# Patient Record
Sex: Male | Born: 1965 | Race: White | Hispanic: No | Marital: Single | Smoking: Current every day smoker
Health system: Southern US, Community
[De-identification: ages and names within clinical notes are randomized; demographics above are authoritative.]

## PROBLEM LIST (undated history)

## (undated) HISTORY — PX: ELBOW SURGERY: SHX618

## (undated) HISTORY — PX: SHOULDER SURGERY: SHX246

## (undated) HISTORY — PX: HAND SURGERY: SHX662

## (undated) HISTORY — PX: LEG SURGERY: SHX1003

## (undated) HISTORY — PX: NOSE SURGERY: SHX723

---

## 2006-02-06 ENCOUNTER — Emergency Department (HOSPITAL_COMMUNITY): Admission: EM | Admit: 2006-02-06 | Discharge: 2006-02-06 | Payer: Self-pay | Admitting: Emergency Medicine

## 2006-02-09 ENCOUNTER — Emergency Department (HOSPITAL_COMMUNITY): Admission: EM | Admit: 2006-02-09 | Discharge: 2006-02-10 | Payer: Self-pay | Admitting: Emergency Medicine

## 2010-05-20 ENCOUNTER — Ambulatory Visit: Payer: Self-pay | Admitting: Otolaryngology

## 2011-07-14 ENCOUNTER — Telehealth: Payer: Self-pay

## 2011-07-14 ENCOUNTER — Ambulatory Visit: Payer: BC Managed Care – PPO

## 2011-07-14 ENCOUNTER — Ambulatory Visit (INDEPENDENT_AMBULATORY_CARE_PROVIDER_SITE_OTHER): Payer: BC Managed Care – PPO | Admitting: Emergency Medicine

## 2011-07-14 VITALS — BP 112/74 | HR 73 | Temp 98.5°F | Resp 16 | Ht 70.5 in | Wt 231.6 lb

## 2011-07-14 DIAGNOSIS — M25569 Pain in unspecified knee: Secondary | ICD-10-CM

## 2011-07-14 DIAGNOSIS — W19XXXA Unspecified fall, initial encounter: Secondary | ICD-10-CM

## 2011-07-14 DIAGNOSIS — M545 Low back pain: Secondary | ICD-10-CM

## 2011-07-14 DIAGNOSIS — R109 Unspecified abdominal pain: Secondary | ICD-10-CM

## 2011-07-14 LAB — POCT URINALYSIS DIPSTICK
Bilirubin, UA: NEGATIVE
Blood, UA: NEGATIVE
Glucose, UA: NEGATIVE
Ketones, UA: NEGATIVE
Spec Grav, UA: 1.02
Urobilinogen, UA: 0.2

## 2011-07-14 MED ORDER — HYDROCODONE-ACETAMINOPHEN 5-325 MG PO TABS: 1.0000 | ORAL_TABLET | Freq: Four times a day (QID) | ORAL | Status: AC | PRN

## 2011-07-14 NOTE — Telephone Encounter (Signed)
Xray up front for pt to p/u. Pt notified

## 2011-07-14 NOTE — Progress Notes (Signed)
  Subjective:    Patient ID: Eric Randall, male    DOB: Apr 25, 1966, 46 y.o.   MRN: [id number]  HPI patient states he was doing well until last week while he was up on a step ladder. He was working on [employer] on his home. He had a pair by prescription on the screw and was pulling backwards when he fell backwards onto a retaining wall struck his lower back and then fell forward injuring his right knee. History is pertinent in that in 1993 he had an accident where he broke his femur and has a rod in 1994 he had another accident in course to have surgery on his right knee. He has done well from this until this most recent injury he is complaining of pain in the proximal fibula.    Review of Systems review of systems is positive Eric Randall relates to this acute injury.     Objective:   Physical Exam physical exam shows abrasions present over the upper lumbar area and lower lumbar area. There is tenderness to palpation in both of these areas. Examination of the right knee reveals tenderness which is primarily over the head of the fibula. I have a joint effusion elicited over the knee itself. The area of maximal tenderness is posterior fibular head.  [facility] reading (PRIMARY) by  Dr.Desarai Barrack x-ray shows a crack at the screw that enters through the fibula but the bone is healed around this area. I do not see any fracture through the head of the fibula. Via the back films revealed no fracture per       Assessment & Plan:  Assessment is recent fall with injury to the upper lumbar lower lumbar and right knee area within check her urine to be sure he did not have any renal contusion and get films of his back and right knee.

## 2011-07-14 NOTE — Telephone Encounter (Signed)
Patient would like to pick up a copy of x-ray disc from xrays today.

## 2012-11-22 IMAGING — CR DG KNEE COMPLETE 4+V*R*
4 series · 4 of 4 positions shown · non-contrast
Comparison: None.

CLINICAL DATA: History of painful right knee.  History of injury
from fall.

RIGHT KNEE - COMPLETE 4+ VIEW

[AP]
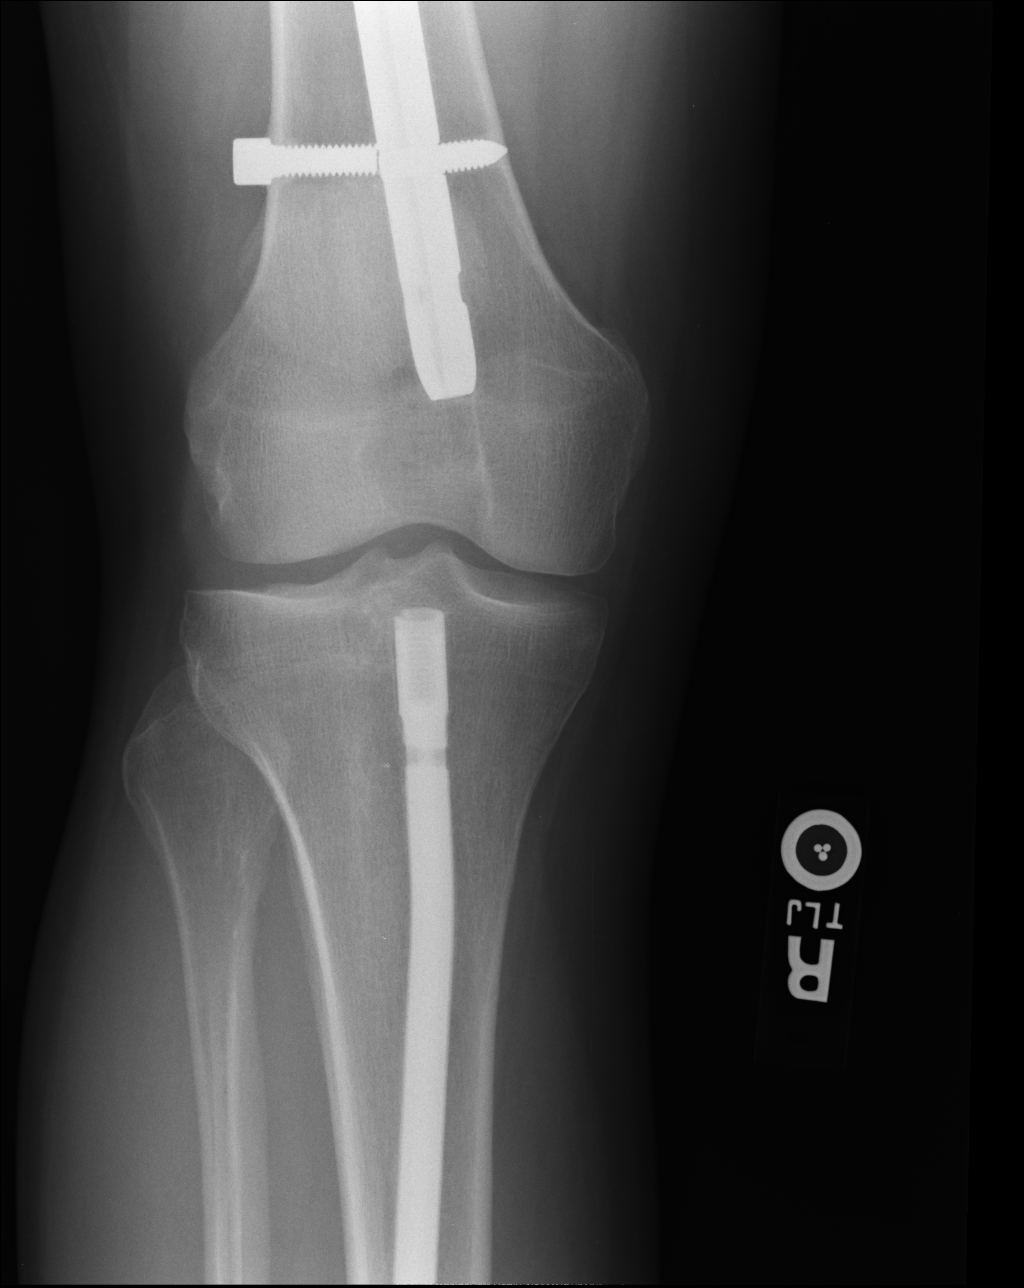

[lateral]
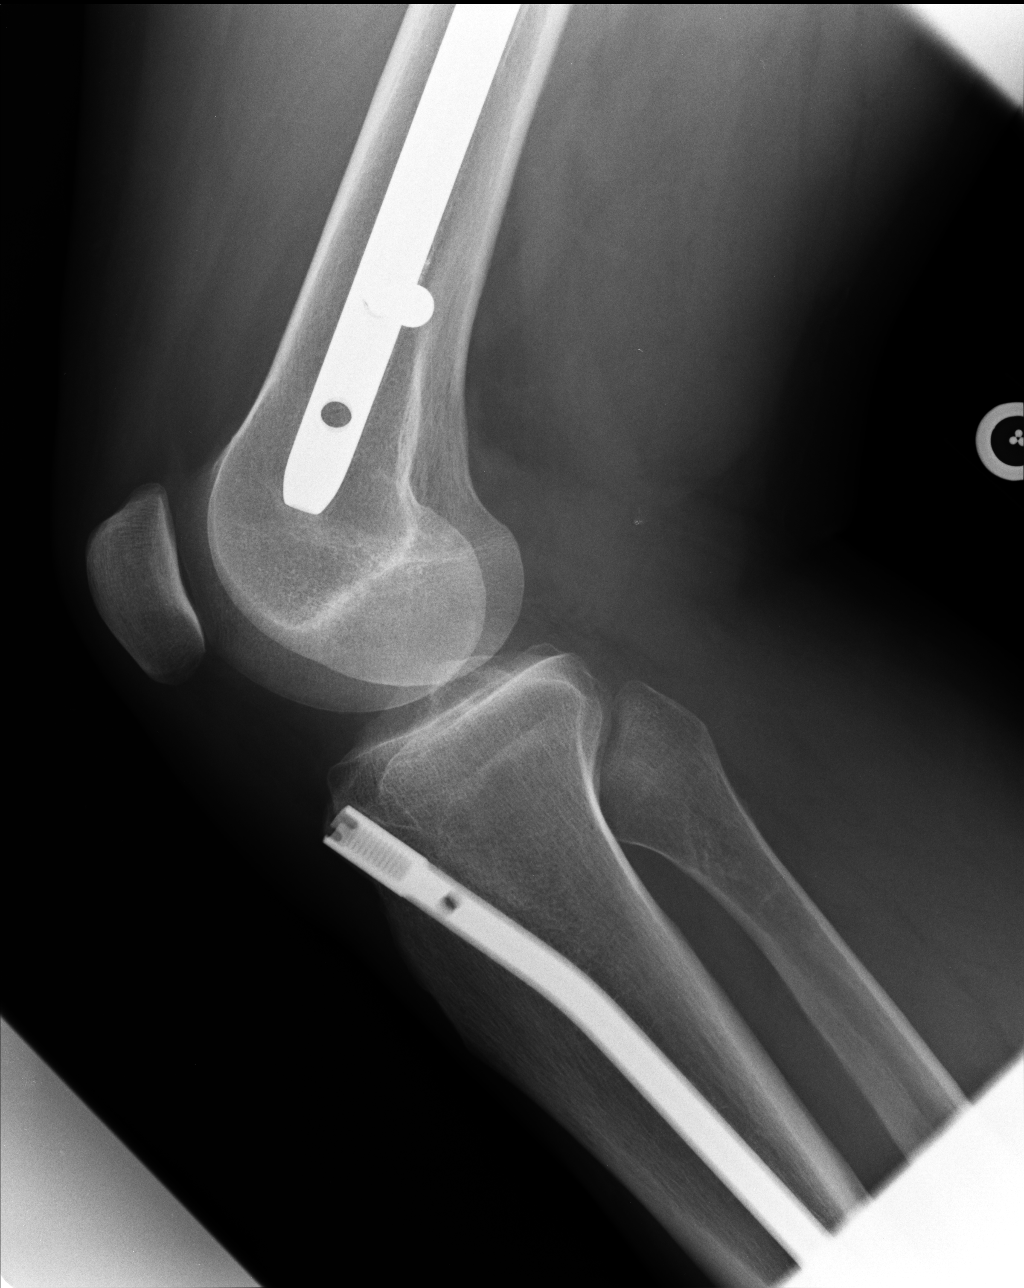

[ap ext rot]
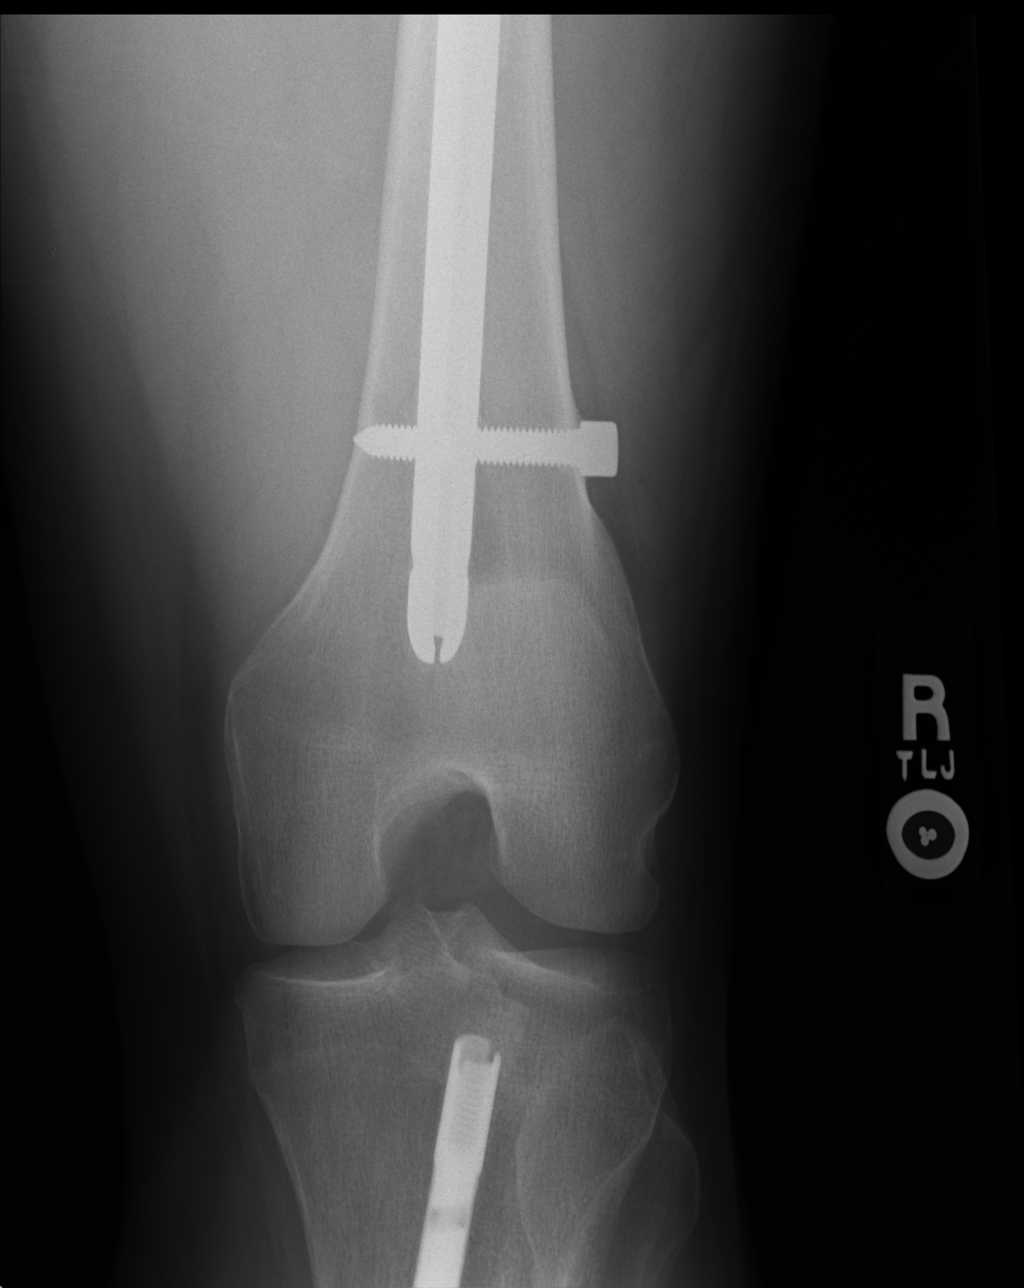

[ap int rot]
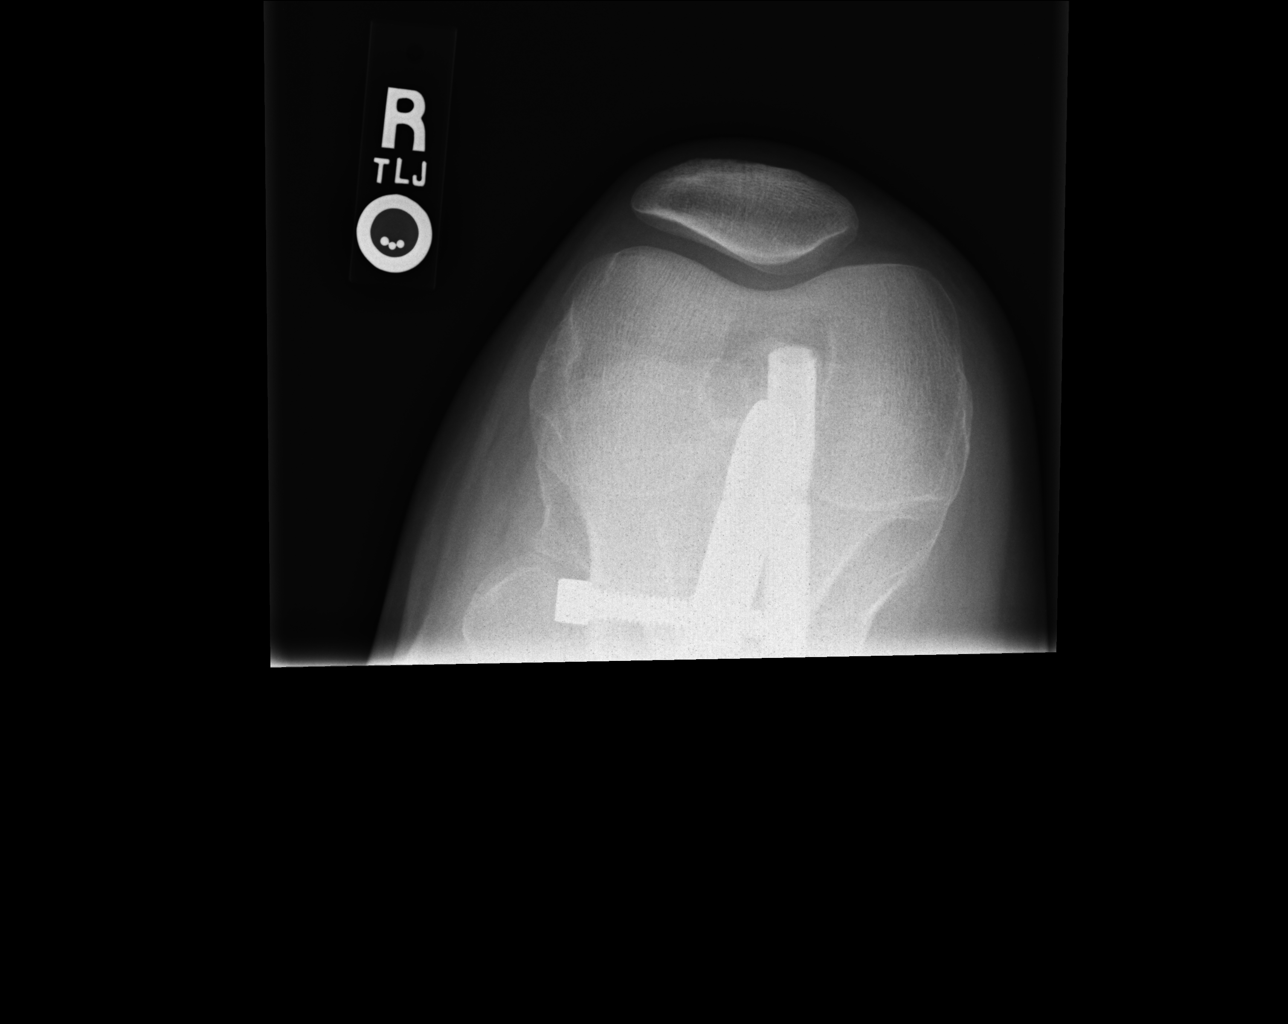

[4 of 4 positions shown; findings below may reference images not displayed]

FINDINGS: Previous ORIF has been performed involving the femur and
tibia.  In the femur there is an intramedullary nail with distal
transverse screw.  On one image there appears to be a crack  in the
screw lateral to the intramedullary nail. There is an
intramedullary nail within the tibia. No abnormal lucency is seen
around hardware.  No bony fracture is evident.  No dislocation is
seen.  Joint spaces are preserved.  No definite joint effusion is
seen.
IMPRESSION: No bony fracture.  No dislocation.  Post ORIF procedures.
 On one image there appears to be a metallic fracture in the screw
lateral to the intramedullary nail, which could be acute or
chronic, but no bony disruption or bony fracture is evident.

## 2013-01-31 ENCOUNTER — Encounter (HOSPITAL_COMMUNITY): Payer: Self-pay | Admitting: Emergency Medicine

## 2013-01-31 ENCOUNTER — Emergency Department (HOSPITAL_COMMUNITY)
Admission: EM | Admit: 2013-01-31 | Discharge: 2013-01-31 | Disposition: A | Discharge status: Home / Self Care | Payer: BC Managed Care – PPO | Attending: Emergency Medicine | Admitting: Emergency Medicine

## 2013-01-31 DIAGNOSIS — H109 Unspecified conjunctivitis: Secondary | ICD-10-CM | POA: Insufficient documentation

## 2013-01-31 DIAGNOSIS — F17200 Nicotine dependence, unspecified, uncomplicated: Secondary | ICD-10-CM | POA: Insufficient documentation

## 2013-01-31 DIAGNOSIS — J069 Acute upper respiratory infection, unspecified: Secondary | ICD-10-CM

## 2013-01-31 DIAGNOSIS — Z79899 Other long term (current) drug therapy: Secondary | ICD-10-CM | POA: Insufficient documentation

## 2013-01-31 NOTE — ED Notes (Signed)
Pt seen at a urgent care this week and has been using drops w/ no improvement, pt states also has a sore throat now.

## 2013-01-31 NOTE — ED Provider Notes (Signed)
CSN: [id number]     Arrival date & time 01/31/13  0340 History   First MD Initiated Contact with Patient 01/31/13 [phone]     Chief Complaint  Patient presents with  . Conjunctivitis  . Sore Throat    HPI Patient reports 3 days of conjunctival injection and eyeI irritation.  He reports discharge and crusting of his eyes.  The patient has been on antibiotic drops for his eyes without improvement in symptoms and now has developed new worsening sore throat over the past 24 hours.  He denies fevers and chills.  He reports upper respiratory symptoms.  No significant change in his vision.  No other complaints.  Symptoms are mild to moderate in severity   History reviewed. No pertinent past medical history. Past Surgical History  Procedure Laterality Date  . Nose surgery     No family history on file. History  Substance Use Topics  . Smoking status: Current Every Day Smoker -- 1.00 packs/day    Types: Cigarettes  . Smokeless tobacco: Not on file  . Alcohol Use: No    Review of Systems  All other systems reviewed and are negative.    Allergies  Review of patient's allergies indicates no known allergies.  Home Medications   Current Outpatient Rx  Name  Route  Sig  Dispense  Refill  . ibuprofen (ADVIL,MOTRIN) 800 MG tablet   Oral   Take 800 mg by mouth every 8 (eight) hours as needed.         . Multiple Vitamin (MULTIVITAMIN) capsule   Oral   Take 1 capsule by mouth daily.         Marland Kitchen trimethoprim-polymyxin b (POLYTRIM) ophthalmic solution   Both Eyes   Place 2 drops into both eyes every 4 (four) hours.         Marland Kitchen UNABLE TO FIND      hydroxcut ( weight loss)          BP 135/93  Pulse 73  Temp(Src) 98.1 F (36.7 C) (Oral)  SpO2 99% Physical Exam  Nursing note and vitals reviewed. Constitutional: He is oriented to person, place, and time. He appears well-developed and well-nourished.  HENT:  Head: Normocephalic and atraumatic.  Uvula midline. No tonsillar  swelling. Mild posterior pharyngeal erythema. No exudates  Eyes: EOM are normal.  Bilateral conjunctival injection with discharge  Neck: Normal range of motion.  Cardiovascular: Normal rate, regular rhythm, normal heart sounds and intact distal pulses.   Pulmonary/Chest: Effort normal and breath sounds normal. No respiratory distress.  Abdominal: Soft. He exhibits no distension. There is no tenderness.  Musculoskeletal: Normal range of motion.  Neurological: He is alert and oriented to person, place, and time.  Skin: Skin is warm and dry.  Psychiatric: He has a normal mood and affect. Judgment normal.    ED Course  Procedures (including critical care time) Labs Review Labs Reviewed - No data to display Imaging Review No results found.  MDM   1. Viral URI   2. Conjunctivitis    Viral URI with conjunctivitis.     Lyanne Co, MD 01/31/13 [phone]

## 2020-06-27 ENCOUNTER — Other Ambulatory Visit: Payer: Self-pay

## 2020-06-27 ENCOUNTER — Ambulatory Visit
Admission: EM | Admit: 2020-06-27 | Discharge: 2020-06-27 | Disposition: A | Discharge status: Home / Self Care | Payer: Self-pay | Attending: Emergency Medicine | Admitting: Emergency Medicine

## 2020-06-27 DIAGNOSIS — M545 Low back pain, unspecified: Secondary | ICD-10-CM

## 2020-06-27 DIAGNOSIS — M62830 Muscle spasm of back: Secondary | ICD-10-CM

## 2020-06-27 MED ORDER — KETOROLAC TROMETHAMINE 60 MG/2ML IM SOLN
60.0000 mg | Freq: Once | INTRAMUSCULAR | Status: AC
  Administered 2020-06-27: 60 mg via INTRAMUSCULAR

## 2020-06-27 MED ORDER — PREDNISONE 10 MG (21) PO TBPK: ORAL_TABLET | Freq: Every day | ORAL | 0 refills | Status: DC

## 2020-06-27 MED ORDER — TIZANIDINE HCL 4 MG PO TABS: 4.0000 mg | ORAL_TABLET | Freq: Four times a day (QID) | ORAL | 0 refills | Status: DC | PRN

## 2020-06-27 MED ORDER — DEXAMETHASONE SODIUM PHOSPHATE 10 MG/ML IJ SOLN
10.0000 mg | Freq: Once | INTRAMUSCULAR | Status: AC
  Administered 2020-06-27: 10 mg via INTRAMUSCULAR

## 2020-06-27 NOTE — ED Provider Notes (Signed)
[facility]   [id number] 06/27/20 Arrival Time: 3893  CC: Back PAIN  SUBJECTIVE: History from: patient. Eric Randall is a 55 y.o. male complains of RT low back pain and tightness x few days.  Denies a precipitating event or specific injury.  Localizes the pain to the RT low back.  Describes the pain as constant and stabbing in character.  Has tried OTC medications without relief.  Symptoms are made worse with movement.  Denies similar symptoms in the past.  Denies fever, chills, erythema, ecchymosis, effusion, weakness, numbness and tingling, saddle paresthesias, loss of bowel or bladder function.      ROS: As per HPI.  All other pertinent ROS negative.     No past medical history on file. Past Surgical History:  Procedure Laterality Date  . NOSE SURGERY     No Known Allergies No current facility-administered medications on file prior to encounter.   Current Outpatient Medications on File Prior to Encounter  Medication Sig Dispense Refill  . ibuprofen (ADVIL,MOTRIN) 800 MG tablet Take 800 mg by mouth every 8 (eight) hours as needed.    . Multiple Vitamin (MULTIVITAMIN) capsule Take 1 capsule by mouth daily.     Social History   Socioeconomic History  . Marital status: Single    Spouse name: Not on file  . Number of children: Not on file  . Years of education: Not on file  . Highest education level: Not on file  Occupational History  . Not on file  Tobacco Use  . Smoking status: Current Every Day Smoker    Packs/day: 1.00    Types: Cigarettes  . Smokeless tobacco: Not on file  Substance and Sexual Activity  . Alcohol use: No  . Drug use: Not on file  . Sexual activity: Not on file  Other Topics Concern  . Not on file  Social History Narrative  . Not on file   Social Determinants of Health   Financial Resource Strain: Not on file  Food Insecurity: Not on file  Transportation Needs: Not on file  Physical Activity: Not on file  Stress: Not on file   Social Connections: Not on file  Intimate Partner Violence: Not on file   No family history on file.  OBJECTIVE:  Vitals:   06/27/20 0935  BP: (!) 149/79  Pulse: 96  Resp: 20  Temp: 97.6 F (36.4 C)  TempSrc: Oral  SpO2: 97%    General appearance: ALERT; appears uncomfortable Head: NCAT Lungs: Normal respiratory effort Musculoskeletal: Back Inspection: Skin warm, dry, clear and intact without obvious erythema, effusion, or ecchymosis.  Palpation: Diffusely TTP over RT low back with associated spasm ROM: LROM about the back Strength: deferred Skin: warm and dry Neurologic: Ambulates with difficulty; Sensation intact about the upper/ lower extremities Psychological: alert and cooperative; normal mood and affect  ASSESSMENT & PLAN:  1. Acute right-sided low back pain without sciatica   2. Back spasm     Meds ordered this encounter  Medications  . predniSONE (STERAPRED UNI-PAK 21 TAB) 10 MG (21) TBPK tablet    Sig: Take by mouth daily. Take 6 tabs by mouth daily  for 2 days, then 5 tabs for 2 days, then 4 tabs for 2 days, then 3 tabs for 2 days, 2 tabs for 2 days, then 1 tab by mouth daily for 2 days    Dispense:  42 tablet    Refill:  0    Order Specific Question:   Supervising Provider  AnswerEustace Moore [[id number]]  . tiZANidine (ZANAFLEX) 4 MG tablet    Sig: Take 1 tablet (4 mg total) by mouth every 6 (six) hours as needed for muscle spasms.    Dispense:  30 tablet    Refill:  0    Order Specific Question:   Supervising Provider    Answer:   Eustace Moore [[id number]]  . dexamethasone (DECADRON) injection 10 mg  . ketorolac (TORADOL) injection 60 mg   Discussed possibility of herniated disc due to pain limited mobility.  Herniated disc evaluation would be best done in the ED.  Patient declines at this time and would like to try outpatient therapy first Decadron and toradol shots given in office Continue conservative management of rest, ice, and  gentle stretches Prednisone taper prescribed.  Use as directed and to completion Take zanaflex at nighttime for symptomatic relief. Avoid driving or operating heavy machinery while using medication. Follow up with PCP if symptoms persist Return or go to the ER if you have any new or worsening symptoms (fever, chills, chest pain, abdominal pain, changes in bowel or bladder habits, pain radiating into lower legs, etc...)    Reviewed expectations re: course of current medical issues. Questions answered. Outlined signs and symptoms indicating need for more acute intervention. Patient verbalized understanding. After Visit Summary given.    Rennis Harding, PA-C 06/27/20 1023

## 2020-06-27 NOTE — Discharge Instructions (Signed)
Discussed possibility of herniated disc due to pain limited mobility.  Herniated disc evaluation would be best done in the ED.  Patient declines at this time and would like to try outpatient therapy first Decadron and toradol shots given in office Continue conservative management of rest, ice, and gentle stretches Prednisone taper prescribed.  Use as directed and to completion Take zanaflex at nighttime for symptomatic relief. Avoid driving or operating heavy machinery while using medication. Follow up with PCP if symptoms persist Return or go to the ER if you have any new or worsening symptoms (fever, chills, chest pain, abdominal pain, changes in bowel or bladder habits, pain radiating into lower legs, etc...)

## 2020-06-27 NOTE — ED Triage Notes (Signed)
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  The center and right lower side of his back is hurting.

## 2020-06-29 ENCOUNTER — Emergency Department (HOSPITAL_COMMUNITY)
Admission: EM | Admit: 2020-06-29 | Discharge: 2020-06-29 | Disposition: A | Discharge status: Home / Self Care | Payer: Self-pay | Attending: Emergency Medicine | Admitting: Emergency Medicine

## 2020-06-29 ENCOUNTER — Encounter (HOSPITAL_COMMUNITY): Payer: Self-pay | Admitting: *Deleted

## 2020-06-29 ENCOUNTER — Other Ambulatory Visit: Payer: Self-pay

## 2020-06-29 DIAGNOSIS — M545 Low back pain, unspecified: Secondary | ICD-10-CM | POA: Insufficient documentation

## 2020-06-29 DIAGNOSIS — F17210 Nicotine dependence, cigarettes, uncomplicated: Secondary | ICD-10-CM | POA: Insufficient documentation

## 2020-06-29 DIAGNOSIS — E669 Obesity, unspecified: Secondary | ICD-10-CM | POA: Insufficient documentation

## 2020-06-29 DIAGNOSIS — Z6837 Body mass index (BMI) 37.0-37.9, adult: Secondary | ICD-10-CM | POA: Insufficient documentation

## 2020-06-29 MED ORDER — KETOROLAC TROMETHAMINE 30 MG/ML IJ SOLN
30.0000 mg | Freq: Once | INTRAMUSCULAR | Status: AC
  Administered 2020-06-29: 30 mg via INTRAMUSCULAR
  Filled 2020-06-29: qty 1

## 2020-06-29 MED ORDER — LIDOCAINE-EPINEPHRINE 2 %-1:100000 IJ SOLN
20.0000 mL | Freq: Once | INTRAMUSCULAR | Status: AC
  Administered 2020-06-29: 20 mL
  Filled 2020-06-29: qty 20

## 2020-06-29 MED ORDER — METHOCARBAMOL 500 MG PO TABS
500.0000 mg | ORAL_TABLET | Freq: Once | ORAL | Status: AC
  Administered 2020-06-29: 500 mg via ORAL
  Filled 2020-06-29: qty 1

## 2020-06-29 MED ORDER — BUPIVACAINE HCL (PF) 0.25 % IJ SOLN
10.0000 mL | Freq: Once | INTRAMUSCULAR | Status: AC
  Administered 2020-06-29: 10 mL
  Filled 2020-06-29: qty 30

## 2020-06-29 MED ORDER — LIDOCAINE-EPINEPHRINE (PF) 2 %-1:200000 IJ SOLN
INTRAMUSCULAR | Status: AC
  Administered 2020-06-29: 20 mL
  Filled 2020-06-29: qty 20

## 2020-06-29 MED ORDER — HYDROCODONE-ACETAMINOPHEN 5-325 MG PO TABS: 1.0000 | ORAL_TABLET | Freq: Four times a day (QID) | ORAL | 0 refills | Status: AC | PRN

## 2020-06-29 MED ORDER — METHOCARBAMOL 500 MG PO TABS: 500.0000 mg | ORAL_TABLET | Freq: Two times a day (BID) | ORAL | 0 refills | Status: AC

## 2020-06-29 NOTE — Discharge Instructions (Addendum)
You were seen here today for Back Pain: Low back pain is discomfort in the lower back that may be due to injuries to muscles and ligaments around the spine. Occasionally, it may be caused by a problem to a part of the spine called a disc. Your back pain should be treated with medicines listed below as well as back exercises and this back pain should get better over the next 2 weeks. Most patients get completely well in 4 weeks. It is important to know however, if you develop severe or worsening pain, low back pain with fever, numbness, weakness or inability to walk or urinate, you should return to the ER immediately.  Please follow up with your doctor this week for a recheck if still having symptoms.  HOME INSTRUCTIONS Self - care:  The application of heat can help soothe the pain.  Maintaining your daily activities, including walking (this is encouraged), as it will help you get better faster than just staying in bed. Do not life, push, pull anything more than 10 pounds for the next week. I am attaching back exercises that you can do at home to help facilitate your recovery.   Back Exercises - I have attached a handout on back exercises that can be done at home to help facilitate your recovery.   Medications are also useful to help with pain control.   Acetaminophen.  This medication is generally safe, and found over the counter. Take as directed for your age. You should not take more than 8 of the extra strength (500mg ) pills a day (max dose is 4000mg  total OVER one day)  Non steroidal anti inflammatory: This includes medications including Ibuprofen, naproxen and Mobic; These medications help both pain and swelling and are very useful in treating back pain.  They should be taken with food, as they can cause stomach upset, and more seriously, stomach bleeding. Do not combine the medications.   Lidocaine Patch: Salon Pas lidocaine patches (blue and silver box) can be purchased over the counter and worn  for 12 hours for local pain relief   Norco: Can be used every 6-8 hours as needed for severe breakthrough pain, this is a narcotic pain medication, can cause drowsiness, please use with caution.  Muscle relaxants:  These medications can help with muscle tightness that is a cause of lower back pain.  Most of these medications can cause drowsiness, and it is not safe to drive or use dangerous machinery while taking them. They are primarily helpful when taken at night before sleep.  Prednisone -This is an oral steroid.  This medication is best taken with food in the morning.  Please note that this medication can cause anxiety, mood swings, muscle fatigue, increased hunger, weight gain (sodium/fluid retention), poor sleep as well as other symptoms. If you are a diabetic, please monitor your blood sugars at home as this medication can increase your blood sugars. Call your pharmacist if you have any questions.  You will need to follow up with your primary healthcare provider or the Orthopedist in 1-2 weeks for reassessment and persistent symptoms.  Be aware that if you develop new symptoms, such as a fever, leg weakness, difficulty with or loss of control of your urine or bowels, abdominal pain, or more severe pain, you will need to seek medical attention and/or return to the Emergency department. Additional Information:  Your vital signs today were: BP (!) 156/82 (BP Location: Left Arm)   Pulse 83   Temp 98.2 F (  36.8 C) (Oral)   Resp 18   Ht 5\' 10"  (1.778 m)   Wt 117.9 kg   SpO2 100%   BMI 37.30 kg/m  If your blood pressure (BP) was elevated above 135/85 this visit, please have this repeated by your doctor within one month. ---------------

## 2020-06-29 NOTE — ED Provider Notes (Signed)
[facility] EMERGENCY DEPARTMENT Provider Note   CSN: [id number] Arrival date & time: 06/29/20  1962     History Chief Complaint  Patient presents with  . Back Pain    Eric Randall is a 55 y.o. male.  Eric Randall is a 55 y.o. male who is healthy, presents to the emergency department for continued right low back pain.  Patient was seen and evaluated for this on Saturday at urgent care.  He has been treating pain with NSAIDs, tizanidine and steroids.  Reports initially he felt like he was getting some improvement, but this morning woke up and pain was worse.  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  He denies any radiation of pain into his legs.  No associated numbness, weakness, no loss of bowel or bladder control or saddle anesthesia.  No associated abdominal pain.  Patient reports he has been trying to rest but has needed to get up at least once every hour because he felt like his muscles were stiffening.  Does not feel like he is getting much continued benefit from the medications prescribed by urgent care.  Denies any new injury.  No falls.  They were concerned patient may have a disc herniation.  No other aggravating or alleviating factors.        History reviewed. No pertinent past medical history.  There are no problems to display for this patient.   Past Surgical History:  Procedure Laterality Date  . ELBOW SURGERY Left    ulnar nerve decompression  . HAND SURGERY Right    due to cut tendon  . LEG SURGERY Right    rods and screws in leg   . NOSE SURGERY    . SHOULDER SURGERY Right        No family history on file.  Social History   Tobacco Use  . Smoking status: Current Every Day Smoker    Packs/day: 0.75    Types: Cigarettes  . Smokeless tobacco: Never Used  Vaping Use  . Vaping Use: Never used  Substance Use Topics  . Alcohol use: No  . Drug use:  Never    Home Medications Prior to Admission medications   Medication Sig Start Date End Date Taking? Authorizing Provider  HYDROcodone-acetaminophen (NORCO) 5-325 MG tablet Take 1 tablet by mouth every 6 (six) hours as needed. 06/29/20  Yes Dartha Lodge, PA-C  methocarbamol (ROBAXIN) 500 MG tablet Take 1 tablet (500 mg total) by mouth 2 (two) times daily. 06/29/20  Yes Dartha Lodge, PA-C  ibuprofen (ADVIL,MOTRIN) 800 MG tablet Take 800 mg by mouth every 8 (eight) hours as needed.    [provider]  Multiple Vitamin (MULTIVITAMIN) capsule Take 1 capsule by mouth daily.    [provider]  predniSONE (STERAPRED UNI-PAK 21 TAB) 10 MG (21) TBPK tablet Take by mouth daily. Take 6 tabs by mouth daily  for 2 days, then 5 tabs for 2 days, then 4 tabs for 2 days, then 3 tabs for 2 days, 2 tabs for 2 days, then 1 tab by mouth daily for 2 days 06/27/20   Alvino Chapel [place], PA-C    Allergies    Patient has no known allergies.  Review of Systems   Review of Systems  Constitutional: Negative for chills and fever.  HENT: Negative.   Respiratory: Negative for shortness of breath.   Cardiovascular: Negative  for chest pain.  Gastrointestinal: Negative for abdominal pain, constipation, diarrhea, nausea and vomiting.  Genitourinary: Negative for dysuria, flank pain, frequency and hematuria.  Musculoskeletal: Positive for back pain. Negative for arthralgias, gait problem, joint swelling, myalgias and neck pain.  Skin: Negative for color change, rash and wound.  Neurological: Negative for weakness and numbness.    Physical Exam Updated Vital Signs BP (!) 156/82 (BP Location: Left Arm)   Pulse 83   Temp 98.2 F (36.8 C) (Oral)   Resp 18   Ht 5\' 10"  (1.778 m)   Wt 117.9 kg   SpO2 100%   BMI 37.30 kg/m   Physical Exam Vitals and nursing note reviewed.  Constitutional:      General: He is not in acute distress.    Appearance: Normal appearance. He is well-developed and  well-nourished. He is obese. He is not diaphoretic.     Comments: Well-appearing and in no distress   HENT:     Head: Atraumatic.  Eyes:     General:        Right eye: No discharge.        Left eye: No discharge.  Cardiovascular:     Pulses:          Radial pulses are 2+ on the right side and 2+ on the left side.       Dorsalis pedis pulses are 2+ on the right side and 2+ on the left side.       Posterior tibial pulses are 2+ on the right side and 2+ on the left side.  Pulmonary:     Effort: Pulmonary effort is normal. No respiratory distress.  Abdominal:     General: Bowel sounds are normal. There is no distension.     Palpations: Abdomen is soft. There is no mass.     Tenderness: There is no abdominal tenderness. There is no guarding.     Comments: Abdomen soft, nondistended, nontender to palpation in all quadrants without guarding or peritoneal signs, no CVA tenderness bilaterally  Musculoskeletal:     Cervical back: Neck supple.     Comments: With point tenderness over the right low back musculature over the paraspinal muscles with palpable tension, no step-off or deformity and no overlying skin changes..  Pain made worse with range of motion of the lower extremities, but negative straight leg raise bilaterally  Skin:    General: Skin is warm and dry.     Capillary Refill: Capillary refill takes less than 2 seconds.  Neurological:     Mental Status: He is alert and oriented to person, place, and time.     Comments: Alert, clear speech, following commands. Moving all extremities without difficulty. Bilateral lower extremities with 5/5 strength in proximal and distal muscle groups and with dorsi and plantar flexion. Sensation intact in bilateral lower extremities. 2+ patellar DTRs bilaterally. Ambulatory with steady gait  Psychiatric:        Mood and Affect: Mood and affect and mood normal.        Behavior: Behavior normal.      ED Results / Procedures / Treatments    Labs (all labs ordered are listed, but only abnormal results are displayed) Labs Reviewed - No data to display  EKG None  Radiology No results found.  Procedure Note: Trigger Point Injection for Myofascial pain  Performed by , PA-C  Indication: muscle/myofascial pain Muscle body and tendon sheath of the right lumbar paraspinal muscle(s) were injected with 5  ml 2% lidocaine with epi & 5 ml 0.25% bupivicaine under sterile technique for release of muscle spasm/pain. Patient tolerated well with immediate improvement of symptoms and no immediate complications following procedure.  CPT Code:  82956   Medications Ordered in ED Medications  lidocaine-EPINEPHrine (XYLOCAINE W/EPI) 2 %-1:100000 (with pres) injection 20 mL (has no administration in time range)  bupivacaine (PF) (MARCAINE) 0.25 % injection 10 mL (has no administration in time range)  lidocaine-EPINEPHrine (XYLOCAINE W/EPI) 2 %-1:200000 (PF) injection (has no administration in time range)  ketorolac (TORADOL) 30 MG/ML injection 30 mg (30 mg Intramuscular Given 06/29/20 1021)  methocarbamol (ROBAXIN) tablet 500 mg (500 mg Oral Given 06/29/20 1021)    ED Course  I have reviewed the triage vital signs and the nursing notes.  Pertinent labs & imaging results that were available during my care of the patient were reviewed by me and considered in my medical decision making (see chart for details).    MDM Rules/Calculators/A&P                         Patient presents with complaint of back pain.  Patient is nontoxic appearing, vitals are WNL. Patient has normal neurologic exam, no point/focal midline tenderness to palpation.  Patient does have some focal paraspinal tenderness and is able to put one finger over the area of pain with movement.   Ambulatory in the ED.  No back pain red flags. No urinary sxs. Most likely muscle strain versus spasm. Considered UTI/pyelonephritis, kidney stone, aortic aneurysm/dissection, cauda  equina or epidural abscess however these do not feel these diagnoses fit clinical picture at this time.  Trigger point injection performed with improvement. Will continue to treat with NSAIDs and steroids, patient had better relief with Robaxin here in the ED rather than tizanidine he has been using at home.  Will prescribe this as well as a short course of pain medication., discussed with patient that they are not to drive or operate heavy machinery while taking Norco and Robaxin. I discussed treatment plan, need for PCP/orthopedic follow-up, and return precautions with the patient. Provided opportunity for questions, patient confirmed understanding and is in agreement with plan.   Final Clinical Impression(s) / ED Diagnoses Final diagnoses:  Acute right-sided low back pain without sciatica    Rx / DC Orders ED Discharge Orders         Ordered    methocarbamol (ROBAXIN) 500 MG tablet  2 times daily        06/29/20 1054    HYDROcodone-acetaminophen (NORCO) 5-325 MG tablet  Every 6 hours PRN        06/29/20 1054           Dartha Lodge, [place] 06/29/20 1056    Terald Sleeper, MD 06/29/20 1836

## 2020-06-29 NOTE — ED Triage Notes (Signed)
Pt c/o right lower back pain that started Saturday before last while working under the hood of the car and "twisting". Pt reports he went to Urgent Care 2 days ago and was given medication but it hasn't helped. He was told by Urgent Care to come to the ED for possible MRI if the pain was no better. Denies numbness, tingling legs, bowel and urinary incontinence.

## 2020-06-30 ENCOUNTER — Ambulatory Visit: Payer: Self-pay | Admitting: Orthopedic Surgery

## 2020-06-30 ENCOUNTER — Encounter: Payer: Self-pay | Admitting: Orthopedic Surgery

## 2020-06-30 ENCOUNTER — Ambulatory Visit: Payer: Self-pay

## 2020-06-30 VITALS — BP 145/93 | HR 82 | Ht 70.0 in | Wt 267.0 lb

## 2020-06-30 DIAGNOSIS — M545 Low back pain, unspecified: Secondary | ICD-10-CM

## 2020-06-30 NOTE — Progress Notes (Signed)
New Patient Visit  Assessment: Eric Randall is a 55 y.o. male with the following: Right-sided low back pain, without radiculopathy  Plan: Patient has isolated right sided lower back pain, without radiating pains into his lower leg.  Radiographs obtained in clinic today demonstrates no acute injury.  Overall alignment remains good.  There is no evidence of anterolisthesis.  He is on the appropriate medications including NSAIDs and a prednisone taper.  He also has methocarbamol to take as needed.  Although his pain is currently severe, and limiting his activity, I do think that this will continue to improve.  At this time, there is no evidence of nerve compression.  We did discuss the return precautions and he stated his understanding.  If he continues to have issues in his lower back, the next consideration would be an image guided injection.  If he is interested, we will have to place a referral.   Follow-up: Return in about 4 weeks (around 07/28/2020).  Subjective:  Chief Complaint  Patient presents with  . Back Pain    Lower right side back pain no radiating pain for 1 wks after leaning overto look under hood of car.     History of Present Illness: Eric Randall is a 55 y.o. male who presents for evaluation of lower back pain.  The pain started approximately 1 week ago, when he was looking under the hood of his car.  No specific injury, but he did note acute worsening at the time.  Following this episode, his pain is significantly worsened.  He presented to the urgent care facility a few days ago, and was given an IM injection of Toradol, as well as Decadron.  A couple of days later his pain was worse so he returned to the emergency department, and had a local injection which improved his pain, but did not maintain his relief.  He is also given prednisone and methocarbamol to assist with the pain.  There is no radiating pains into his right leg.  No issues with his bowel or bladder  function.   Review of Systems: No fevers or chills No numbness or tingling No chest pain No shortness of breath No bowel or bladder dysfunction No GI distress No headaches   Medical History:  History reviewed. No pertinent past medical history.  Past Surgical History:  Procedure Laterality Date  . ELBOW SURGERY Left    ulnar nerve decompression  . HAND SURGERY Right    due to cut tendon  . LEG SURGERY Right    rods and screws in leg   . NOSE SURGERY    . SHOULDER SURGERY Right     History reviewed. No pertinent family history. Social History   Tobacco Use  . Smoking status: Current Every Day Smoker    Packs/day: 0.75    Types: Cigarettes  . Smokeless tobacco: Never Used  Vaping Use  . Vaping Use: Never used  Substance Use Topics  . Alcohol use: No  . Drug use: Never    No Known Allergies  Current Meds  Medication Sig  . HYDROcodone-acetaminophen (NORCO) 5-325 MG tablet Take 1 tablet by mouth every 6 (six) hours as needed.  Marland Kitchen ibuprofen (ADVIL,MOTRIN) 800 MG tablet Take 800 mg by mouth every 8 (eight) hours as needed.  . methocarbamol (ROBAXIN) 500 MG tablet Take 1 tablet (500 mg total) by mouth 2 (two) times daily.  . Multiple Vitamin (MULTIVITAMIN) capsule Take 1 capsule by mouth daily.  . predniSONE (STERAPRED UNI-PAK 21  TAB) 10 MG (21) TBPK tablet Take by mouth daily. Take 6 tabs by mouth daily  for 2 days, then 5 tabs for 2 days, then 4 tabs for 2 days, then 3 tabs for 2 days, 2 tabs for 2 days, then 1 tab by mouth daily for 2 days    Objective: Ht 5\' 10"  (1.778 m)   Wt 267 lb (121.1 kg)   BMI 38.31 kg/m   Physical Exam:  General: Alert and oriented.  In obvious distress due to the pain.   Gait: Patient ambulates with a stiff, straight leg gait.  In obvious discomfort.  Evaluation of his lower back demonstrates no obvious deformity.  No step-offs are appreciated.  He is point tender over the right SI joint.  Restricted range of motion of his lower  back due to the pain.  He is able to maintain a straight leg raise.  Sensation is intact distally.   IMAGING: I personally ordered and reviewed the following images   Standing lumbar x-rays were obtained in clinic today and demonstrates no acute injury.  There is no evidence of listhesis.  He does have mild degenerative changes within the lumbar spine.  X-rays obtained today did demonstrate some pelvic obliquity.  Impression: Normal-appearing lumbar spine x-rays   New Medications:  No orders of the defined types were placed in this encounter.     , MD  06/30/2020 2:36 PM

## 2022-01-28 ENCOUNTER — Ambulatory Visit
Admission: EM | Admit: 2022-01-28 | Discharge: 2022-01-28 | Disposition: A | Discharge status: Home / Self Care | Payer: Self-pay | Attending: Family Medicine | Admitting: Family Medicine

## 2022-01-28 DIAGNOSIS — L0291 Cutaneous abscess, unspecified: Secondary | ICD-10-CM

## 2022-01-28 MED ORDER — DOXYCYCLINE HYCLATE 100 MG PO CAPS: 100.0000 mg | ORAL_CAPSULE | Freq: Two times a day (BID) | ORAL | 0 refills | Status: AC

## 2022-01-28 NOTE — ED Triage Notes (Signed)
Pt complains of bump under chin. Sore to the touch

## 2022-01-28 NOTE — ED Provider Notes (Signed)
[facility]    CSN: [id number] Arrival date & time: 01/28/22  0935      History   Chief Complaint Chief Complaint  Patient presents with   Abscess    HPI Eric Randall is a 56 y.o. male.   Presenting today with about a week history of a sore bump under his chin that has gotten larger and more painful over the course of the week.  The area is red, warm but no drainage or bleeding noted.  Denies fever, chills, injury to the area, body aches, sweats.  So far not trying anything over-the-counter for his symptoms apart from ibuprofen here and there.    History reviewed. No pertinent past medical history.  There are no problems to display for this patient.   Past Surgical History:  Procedure Laterality Date   ELBOW SURGERY Left    ulnar nerve decompression   HAND SURGERY Right    due to cut tendon   LEG SURGERY Right    rods and screws in leg    NOSE SURGERY     SHOULDER SURGERY Right        Home Medications    Prior to Admission medications   Medication Sig Start Date End Date Taking? Authorizing Provider  doxycycline (VIBRAMYCIN) 100 MG capsule Take 1 capsule (100 mg total) by mouth 2 (two) times daily. 01/28/22  Yes Volney American, PA-C  HYDROcodone-acetaminophen (NORCO) 5-325 MG tablet Take 1 tablet by mouth every 6 (six) hours as needed. 06/29/20   Jacqlyn Larsen, PA-C  ibuprofen (ADVIL,MOTRIN) 800 MG tablet Take 800 mg by mouth every 8 (eight) hours as needed.    [provider]  methocarbamol (ROBAXIN) 500 MG tablet Take 1 tablet (500 mg total) by mouth 2 (two) times daily. 06/29/20   Jacqlyn Larsen, PA-C  Multiple Vitamin (MULTIVITAMIN) capsule Take 1 capsule by mouth daily.    [provider]  predniSONE (STERAPRED UNI-PAK 21 TAB) 10 MG (21) TBPK tablet Take by mouth daily. Take 6 tabs by mouth daily  for 2 days, then 5 tabs for 2 days, then 4 tabs for 2 days, then 3 tabs for 2 days, 2 tabs for 2 days, then 1 tab by mouth daily  for 2 days 06/27/20   Stacey Drain [place], PA-C    Family History History reviewed. No pertinent family history.  Social History Social History   Tobacco Use   Smoking status: Every Day    Packs/day: 0.75    Types: Cigarettes   Smokeless tobacco: Never  Vaping Use   Vaping Use: Never used  Substance Use Topics   Alcohol use: No   Drug use: Never     Allergies   Penicillins   Review of Systems Review of Systems Per HPI  Physical Exam Triage Vital Signs ED Triage Vitals  Enc Vitals Group     BP 01/28/22 0940 (!) 182/94     Pulse Rate 01/28/22 0940 89     Resp 01/28/22 0941 18     Temp 01/28/22 0940 98.9 F (37.2 C)     Temp Source 01/28/22 0940 Oral     SpO2 01/28/22 0941 95 %     Weight --      Height --      Head Circumference --      Peak Flow --      Pain Score 01/28/22 0942 0     Pain Loc --      Pain Edu? --  Excl. in [place]? --    No data found.  Updated Vital Signs BP (!) 182/94 (BP Location: Right Arm)   Pulse 89   Temp 98.9 F (37.2 C) (Oral)   Resp 18   SpO2 95%   Visual Acuity Right Eye Distance:   Left Eye Distance:   Bilateral Distance:    Right Eye Near:   Left Eye Near:    Bilateral Near:     Physical Exam Vitals and nursing note reviewed.  Constitutional:      Appearance: Normal appearance.  HENT:     Head: Atraumatic.     Nose: Nose normal.     Mouth/Throat:     Mouth: Mucous membranes are moist.  Eyes:     Extraocular Movements: Extraocular movements intact.     Conjunctiva/sclera: Conjunctivae normal.  Cardiovascular:     Rate and Rhythm: Normal rate and regular rhythm.  Pulmonary:     Effort: Pulmonary effort is normal.     Breath sounds: Normal breath sounds.  Musculoskeletal:        General: Normal range of motion.     Cervical back: Normal range of motion and neck supple.  Skin:    General: Skin is warm and dry.     Findings: Erythema present.     Comments: 1 cm erythematous, edematous and mildly fluctuant  region below chin toward the right side.  Pustular center.  No active drainage or bleeding  Neurological:     General: No focal deficit present.     Mental Status: He is oriented to person, place, and time.     Motor: No weakness.     Gait: Gait normal.  Psychiatric:        Mood and Affect: Mood normal.        Thought Content: Thought content normal.        Judgment: Judgment normal.      UC Treatments / Results  Labs (all labs ordered are listed, but only abnormal results are displayed) Labs Reviewed - No data to display  EKG   Radiology No results found.  Procedures Incision and Drainage  Date/Time: 01/28/2022 3:12 PM  Performed by: Particia Nearing, PA-C Authorized by: Particia Nearing, PA-C   Consent:    Consent obtained:  Verbal   Consent given by:  Patient   Risks, benefits, and alternatives were discussed: yes     Risks discussed:  Bleeding, damage to other organs and incomplete drainage   Alternatives discussed:  Alternative treatment Universal protocol:    Procedure explained and questions answered to patient or proxy's satisfaction: yes     Relevant documents present and verified: yes     Patient identity confirmed:  Verbally with patient Location:    Type:  Abscess   Size:  1   Location:  Neck   Neck location:  R anterior Pre-procedure details:    Skin preparation:  Chlorhexidine Sedation:    Sedation type:  None Anesthesia:    Anesthesia method:  Local infiltration   Local anesthetic:  Lidocaine 2% WITH epi Procedure type:    Complexity:  Simple Procedure details:    Incision types:  Stab incision   Incision depth:  Dermal   Wound management:  Probed and deloculated   Drainage:  Purulent and bloody   Drainage amount:  Moderate   Wound treatment:  Wound left open   Packing materials:  None Post-procedure details:    Procedure completion:  Tolerated well, no immediate complications  (  including critical care time)  Medications  Ordered in UC Medications - No data to display  Initial Impression / Assessment and Plan / UC Course  I have reviewed the triage vital signs and the nursing notes.  Pertinent labs & imaging results that were available during my care of the patient were reviewed by me and considered in my medical decision making (see chart for details).     Area was drained today without complication, started on antibiotics, warm compresses, home wound care.  Return for worsening symptoms.  Final Clinical Impressions(s) / UC Diagnoses   Final diagnoses:  Abscess   Discharge Instructions   None    ED Prescriptions     Medication Sig Dispense Auth. Provider   doxycycline (VIBRAMYCIN) 100 MG capsule Take 1 capsule (100 mg total) by mouth 2 (two) times daily. 14 capsule Particia Nearing, [place]      PDMP not reviewed this encounter.   Particia Nearing, [place] 01/28/22 1514

## 2022-07-09 ENCOUNTER — Ambulatory Visit
Admission: EM | Admit: 2022-07-09 | Discharge: 2022-07-09 | Disposition: A | Discharge status: Home / Self Care | Payer: Self-pay | Attending: Nurse Practitioner | Admitting: Nurse Practitioner

## 2022-07-09 ENCOUNTER — Encounter: Payer: Self-pay | Admitting: Emergency Medicine

## 2022-07-09 ENCOUNTER — Ambulatory Visit (INDEPENDENT_AMBULATORY_CARE_PROVIDER_SITE_OTHER): Payer: Self-pay

## 2022-07-09 DIAGNOSIS — R0602 Shortness of breath: Secondary | ICD-10-CM

## 2022-07-09 DIAGNOSIS — R062 Wheezing: Secondary | ICD-10-CM

## 2022-07-09 DIAGNOSIS — R9389 Abnormal findings on diagnostic imaging of other specified body structures: Secondary | ICD-10-CM

## 2022-07-09 DIAGNOSIS — R059 Cough, unspecified: Secondary | ICD-10-CM

## 2022-07-09 DIAGNOSIS — J209 Acute bronchitis, unspecified: Secondary | ICD-10-CM

## 2022-07-09 MED ORDER — PREDNISONE 20 MG PO TABS: 40.0000 mg | ORAL_TABLET | Freq: Every day | ORAL | 0 refills | Status: AC

## 2022-07-09 MED ORDER — PSEUDOEPH-BROMPHEN-DM 30-2-10 MG/5ML PO SYRP: 5.0000 mL | ORAL_SOLUTION | Freq: Four times a day (QID) | ORAL | 0 refills | Status: AC | PRN

## 2022-07-09 NOTE — ED Triage Notes (Signed)
Productive cough with white color sputum and headache since Monday.  Has taken allergy medication and nyquil to help with symptoms.

## 2022-07-09 NOTE — Discharge Instructions (Addendum)
Chest x-ray is negative for pneumonia.  X-ray does show aortic atherosclerosis, which is a buildup of plaque or hardening of the arteries within the aorta of your heart.  Recommend that you follow-up with a primary care physician for further evaluation within the next 7 to 14 days. Take medication as prescribed. Increase fluids and allow for plenty of rest. Recommend sleeping elevated on pillows and using a humidifier in your bedroom at nighttime while cough symptoms persist. Warm salt water gargles 3-4 times daily to help sore throat. Please be advised that the cough may last for several weeks.  If you are feeling well, but continue to have a nagging cough, recommend using over-the-counter cough drops or throat lozenges and increasing your fluids.  If you worsening shortness of breath, difficulty breathing, or become unable to speak in a complete sentence, please follow-up in the emergency department for further evaluation. Follow-up as needed.

## 2022-07-09 NOTE — ED Provider Notes (Signed)
[facility]    CSN: [id number] Arrival date & time: 07/09/22  [id number]      History   Chief Complaint No chief complaint on file.   HPI Eric Randall is a 57 y.o. male.   The history is provided by the patient.   Presents for complaints of cough, breath, and intermittent wheezing.  Symptoms have been present for the past week.  Patient denies fever, chills, ear pain, difficulty breathing, abdominal pain, nausea, vomiting, or diarrhea.  Patient states when his symptoms initially started, he had a headache that is since resolved.  He states that he feels like the cough is in his throat as he does have mild sore throat.  He states the cough worsens when he gets up and starts moving.  He states that he does smoke, he has a 15-year smoking history, smoking almost 1 pack/day.  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  History reviewed. No pertinent past medical history.  There are no problems to display for this patient.   Past Surgical History:  Procedure Laterality Date   ELBOW SURGERY Left    ulnar nerve decompression   HAND SURGERY Right    due to cut tendon   LEG SURGERY Right    rods and screws in leg    NOSE SURGERY     SHOULDER SURGERY Right        Home Medications    Prior to Admission medications   Medication Sig Start Date End Date Taking? Authorizing Provider  brompheniramine-pseudoephedrine-DM 30-2-10 MG/5ML syrup Take 5 mLs by mouth 4 (four) times daily as needed. 07/09/22  Yes Mellonie Guess-Warren, Alda Lea, NP  predniSONE (DELTASONE) 20 MG tablet Take 2 tablets (40 mg total) by mouth daily with breakfast for 5 days. 07/09/22 07/14/22 Yes Shirleen Mcfaul-Warren, Alda Lea, NP  doxycycline (VIBRAMYCIN) 100 MG capsule Take 1 capsule (100 mg total) by mouth 2 (two) times daily. 01/28/22   Volney American, PA-C  HYDROcodone-acetaminophen (NORCO) 5-325 MG tablet Take 1 tablet by mouth every 6 (six) hours as needed. 06/29/20    Jacqlyn Larsen, PA-C  ibuprofen (ADVIL,MOTRIN) 800 MG tablet Take 800 mg by mouth every 8 (eight) hours as needed.    [provider]  methocarbamol (ROBAXIN) 500 MG tablet Take 1 tablet (500 mg total) by mouth 2 (two) times daily. 06/29/20   Jacqlyn Larsen, PA-C  Multiple Vitamin (MULTIVITAMIN) capsule Take 1 capsule by mouth daily.    [provider]    Family History History reviewed. No pertinent family history.  Social History Social History   Tobacco Use   Smoking status: Every Day    Packs/day: .75    Types: Cigarettes   Smokeless tobacco: Never  Vaping Use   Vaping Use: Never used  Substance Use Topics   Alcohol use: No   Drug use: Never     Allergies   Penicillins   Review of Systems Review of Systems Per HPI  Physical Exam Triage Vital Signs ED Triage Vitals  Enc Vitals Group     BP 07/09/22 0919 (!) 167/98     Pulse Rate 07/09/22 0919 93     Resp 07/09/22 0919 18     Temp 07/09/22 0919 98.8 F (37.1 C)     Temp Source 07/09/22 0919 Oral     SpO2 07/09/22 0919 96 %     Weight --      Height --  Head Circumference --      Peak Flow --      Pain Score 07/09/22 0920 0     Pain Loc --      Pain Edu? --      Excl. in [place]? --    No data found.  Updated Vital Signs BP (!) 167/98 (BP Location: Right Arm)   Pulse 93   Temp 98.8 F (37.1 C) (Oral)   Resp 18   SpO2 96%   Visual Acuity Right Eye Distance:   Left Eye Distance:   Bilateral Distance:    Right Eye Near:   Left Eye Near:    Bilateral Near:     Physical Exam Vitals and nursing note reviewed.  Constitutional:      General: He is not in acute distress.    Appearance: Normal appearance.  HENT:     Head: Normocephalic.     Nose: Nose normal.     Mouth/Throat:     Mouth: Mucous membranes are moist.     Pharynx: Posterior oropharyngeal erythema present.  Eyes:     Extraocular Movements: Extraocular movements intact.     Pupils: Pupils are equal, round, and  reactive to light.  Cardiovascular:     Rate and Rhythm: Normal rate and regular rhythm.     Pulses: Normal pulses.     Heart sounds: Normal heart sounds.  Pulmonary:     Effort: Pulmonary effort is normal. No respiratory distress.     Breath sounds: No stridor. Rhonchi (LLL) present. No wheezing or rales.  Abdominal:     General: Bowel sounds are normal.     Palpations: Abdomen is soft.  Musculoskeletal:     Cervical back: Normal range of motion.  Skin:    General: Skin is warm and dry.  Neurological:     General: No focal deficit present.     Mental Status: He is alert and oriented to person, place, and time.  Psychiatric:        Mood and Affect: Mood normal.        Behavior: Behavior normal.      UC Treatments / Results  Labs (all labs ordered are listed, but only abnormal results are displayed) Labs Reviewed - No data to display  EKG   Radiology DG Chest 2 View  Result Date: 07/09/2022 CLINICAL DATA:  Provided history: Shortness of breath.  Wheezing. EXAM: CHEST - 2 VIEW COMPARISON:  Report from chest radiographs 07/02/2012 (images unavailable). FINDINGS: A small portion of the left lateral costophrenic angle is excluded from the field of view on the AP radiograph. Heart size within normal limits. Aortic atherosclerosis. Calcified granulomas within the perihilar lungs, bilaterally. No appreciable airspace consolidation. No evidence of pleural effusion or pneumothorax. No acute osseous abnormality identified. IMPRESSION: A small portion of the left lateral costophrenic angle is excluded from the field of view on the AP radiograph. No evidence of acute cardiopulmonary abnormality. Aortic Atherosclerosis (ICD10-I70.0). Electronically Signed   By: Kellie Simmering D.O.   On: 07/09/2022 10:01    Procedures Procedures (including critical care time)  Medications Ordered in UC Medications - No data to display  Initial Impression / Assessment and Plan / UC Course  I have reviewed  the triage vital signs and the nursing notes.  Pertinent labs & imaging results that were available during my care of the patient were reviewed by me and considered in my medical decision making (see chart for details).  Well-appearing, he is in no  acute distress, vital signs are stable.  X-ray is negative for pneumonia, but does show aortic atherosclerosis.  Patient was advised to follow-up with his primary care within the next 7 to 14 days for further evaluation.  Suspect acute bronchitis, given the patient's 15-year smoking history, and current symptoms.  Will treat with prednisone 40 mg for the next 5 days, along with Bromfed-DM for his cough.  Supportive care recommendations were provided to the patient along with strict ER follow-up precautions.  Patient is in agreement with this plan of care and verbalizes understanding.  All questions were answered.  Patient stable for discharge.   Final Clinical Impressions(s) / UC Diagnoses   Final diagnoses:  Cough, unspecified type  Acute bronchitis, unspecified organism  Abnormal chest x-ray     Discharge Instructions      Chest x-ray is negative for pneumonia.  X-ray does show aortic atherosclerosis, which is a buildup of plaque or hardening of the arteries within the aorta of your heart.  Recommend that you follow-up with a primary care physician for further evaluation within the next 7 to 14 days. Take medication as prescribed. Increase fluids and allow for plenty of rest. Recommend sleeping elevated on pillows and using a humidifier in your bedroom at nighttime while cough symptoms persist. Warm salt water gargles 3-4 times daily to help sore throat. Please be advised that the cough may last for several weeks.  If you are feeling well, but continue to have a nagging cough, recommend using over-the-counter cough drops or throat lozenges and increasing your fluids.  If you worsening shortness of breath, difficulty breathing, or become unable  to speak in a complete sentence, please follow-up in the emergency department for further evaluation. Follow-up as needed.     ED Prescriptions     Medication Sig Dispense Auth. Provider   predniSONE (DELTASONE) 20 MG tablet Take 2 tablets (40 mg total) by mouth daily with breakfast for 5 days. 10 tablet Bryson Palen-Warren, Alda Lea, NP   brompheniramine-pseudoephedrine-DM 30-2-10 MG/5ML syrup Take 5 mLs by mouth 4 (four) times daily as needed. 140 mL Krystol Rocco-Warren, Alda Lea, NP      PDMP not reviewed this encounter.   Tish Men, NP 07/09/22 1014

## 2023-05-17 ENCOUNTER — Ambulatory Visit: Payer: Self-pay | Admitting: Internal Medicine
